# Patient Record
Sex: Male | Born: 1995 | Race: White | Hispanic: No | Marital: Single | Smoking: Never smoker
Health system: Southern US, Community
[De-identification: ages and names within clinical notes are randomized; demographics above are authoritative.]

## PROBLEM LIST (undated history)

## (undated) HISTORY — PX: ADENOIDECTOMY: SUR15

## (undated) HISTORY — PX: TYMPANOSTOMY TUBE PLACEMENT: SHX32

---
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
## 2016-12-06 ENCOUNTER — Other Ambulatory Visit: Payer: Self-pay | Admitting: *Deleted

## 2016-12-06 DIAGNOSIS — Z9189 Other specified personal risk factors, not elsewhere classified: Secondary | ICD-10-CM

## 2016-12-06 MED ORDER — CIPROFLOXACIN HCL 500 MG PO TABS: 500.0000 mg | ORAL_TABLET | Freq: Two times a day (BID) | ORAL | 0 refills | Status: AC

## 2016-12-06 MED ORDER — ATOVAQUONE-PROGUANIL HCL 250-100 MG PO TABS: 1.0000 | ORAL_TABLET | Freq: Every day | ORAL | 0 refills | Status: AC

## 2016-12-06 NOTE — Progress Notes (Signed)
Travel clinic cancelled when patient was scheduled for return visit. Patient is returning to same location in [place], will be traveling 01/16/17 - 01/25/17.  Per Dr Luciana Axeomer, all vaccines are up to date, ok to refill prescriptions. Patient will need only cipro and malarone for this trip.  RN spoke with patient's father, will send refills for this trip to [facility] on [address]  RN will mail [place] information packet to patient. Andree CossHowell, Jeanean Hollett M, RN

## 2016-12-13 ENCOUNTER — Encounter: Payer: 59 | Admitting: Internal Medicine
